# Patient Record
Sex: Female | Born: 1990 | Race: Black or African American | Hispanic: No | Marital: Single | Smoking: Current every day smoker
Health system: Southern US, Community
[De-identification: ages and names within clinical notes are randomized; demographics above are authoritative.]

## PROBLEM LIST (undated history)

## (undated) DIAGNOSIS — C569 Malignant neoplasm of unspecified ovary: Secondary | ICD-10-CM

## (undated) DIAGNOSIS — C801 Malignant (primary) neoplasm, unspecified: Secondary | ICD-10-CM

## (undated) DIAGNOSIS — R569 Unspecified convulsions: Secondary | ICD-10-CM

## (undated) DIAGNOSIS — J45909 Unspecified asthma, uncomplicated: Secondary | ICD-10-CM

---
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
## 2013-04-18 ENCOUNTER — Emergency Department (HOSPITAL_COMMUNITY)
Admission: EM | Admit: 2013-04-18 | Discharge: 2013-04-19 | Disposition: A | Discharge status: Home / Self Care | Payer: BC Managed Care – PPO | Attending: Emergency Medicine | Admitting: Emergency Medicine

## 2013-04-18 ENCOUNTER — Encounter (HOSPITAL_COMMUNITY): Payer: Self-pay | Admitting: Emergency Medicine

## 2013-04-18 DIAGNOSIS — J45901 Unspecified asthma with (acute) exacerbation: Secondary | ICD-10-CM | POA: Insufficient documentation

## 2013-04-18 DIAGNOSIS — Z880 Allergy status to penicillin: Secondary | ICD-10-CM | POA: Insufficient documentation

## 2013-04-18 DIAGNOSIS — F17200 Nicotine dependence, unspecified, uncomplicated: Secondary | ICD-10-CM | POA: Insufficient documentation

## 2013-04-18 DIAGNOSIS — F4321 Adjustment disorder with depressed mood: Secondary | ICD-10-CM | POA: Insufficient documentation

## 2013-04-18 DIAGNOSIS — F419 Anxiety disorder, unspecified: Secondary | ICD-10-CM

## 2013-04-18 DIAGNOSIS — R079 Chest pain, unspecified: Secondary | ICD-10-CM | POA: Insufficient documentation

## 2013-04-18 DIAGNOSIS — Z8543 Personal history of malignant neoplasm of ovary: Secondary | ICD-10-CM | POA: Insufficient documentation

## 2013-04-18 DIAGNOSIS — Z79899 Other long term (current) drug therapy: Secondary | ICD-10-CM | POA: Insufficient documentation

## 2013-04-18 DIAGNOSIS — G40909 Epilepsy, unspecified, not intractable, without status epilepticus: Secondary | ICD-10-CM | POA: Insufficient documentation

## 2013-04-18 DIAGNOSIS — F411 Generalized anxiety disorder: Secondary | ICD-10-CM | POA: Insufficient documentation

## 2013-04-18 HISTORY — DX: Unspecified convulsions: R56.9

## 2013-04-18 HISTORY — DX: Unspecified asthma, uncomplicated: J45.909

## 2013-04-18 HISTORY — DX: Malignant neoplasm of unspecified ovary: C56.9

## 2013-04-18 HISTORY — DX: Malignant (primary) neoplasm, unspecified: C80.1

## 2013-04-18 MED ORDER — LORAZEPAM 1 MG PO TABS
1.0000 mg | ORAL_TABLET | Freq: Once | ORAL | Status: AC
  Administered 2013-04-18: 1 mg via ORAL
  Filled 2013-04-18: qty 1

## 2013-04-18 MED ORDER — ALBUTEROL SULFATE (5 MG/ML) 0.5% IN NEBU
2.5000 mg | INHALATION_SOLUTION | Freq: Once | RESPIRATORY_TRACT | Status: AC
  Administered 2013-04-19: 2.5 mg via RESPIRATORY_TRACT
  Filled 2013-04-18: qty 0.5

## 2013-04-18 MED ORDER — CARBAMAZEPINE ER 200 MG PO CP12
200.0000 mg | ORAL_CAPSULE | Freq: Once | ORAL | Status: AC
  Administered 2013-04-18: 200 mg via ORAL
  Filled 2013-04-18: qty 1

## 2013-04-18 MED ORDER — CARBAMAZEPINE ER 200 MG PO CP12: 200.0000 mg | ORAL_CAPSULE | Freq: Once | ORAL | Status: DC
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
## 2013-04-18 NOTE — ED Notes (Signed)
Pt c/o discomfort in chest. States she has need to use albuterol inhaler on yesterday and nebulizer

## 2013-04-18 NOTE — ED Notes (Signed)
Pt BIB EMS. Pt told EMS she had a verbal altercation with her friend tonight. Pt also told EMS that her mother died this past week. Pt has a hx of epilepsy and told EMS that she is worried that she may have a "epileptic episode". Pt denies SI/HI. Pt admits to drinking 3 alcoholic beverages this evening. Pt calm, cooperative. Pt alert, no acute distress.

## 2013-04-18 NOTE — ED Notes (Signed)
Bed: WU98 Expected date: 04/18/13 Expected time: 10:25 PM Means of arrival: Ambulance Comments: Bed 24, EMS, 29 F, Hx of Seizure

## 2013-04-19 MED ORDER — LORAZEPAM 1 MG PO TABS: 1.0000 mg | ORAL_TABLET | Freq: Four times a day (QID) | ORAL | Status: DC | PRN

## 2013-04-19 NOTE — ED Notes (Signed)
Patient is resting comfortably. 

## 2013-04-19 NOTE — ED Notes (Signed)
Friend at bedside. Pt feeling drowsy from medication. Tolerating Coke. Concern about Discharge.

## 2013-04-20 NOTE — ED Provider Notes (Signed)
Medical screening examination/treatment/procedure(s) were performed by non-physician practitioner and as supervising physician I was immediately available for consultation/collaboration.   Sunnie Nielsen, MD 04/20/13 [phone]

## 2013-08-21 ENCOUNTER — Emergency Department (HOSPITAL_COMMUNITY): Payer: BC Managed Care – PPO

## 2013-08-21 ENCOUNTER — Encounter (HOSPITAL_COMMUNITY): Payer: Self-pay | Admitting: Emergency Medicine

## 2013-08-21 ENCOUNTER — Emergency Department (HOSPITAL_COMMUNITY)
Admission: EM | Admit: 2013-08-21 | Discharge: 2013-08-22 | Disposition: A | Discharge status: Home / Self Care | Payer: BC Managed Care – PPO | Attending: Emergency Medicine | Admitting: Emergency Medicine

## 2013-08-21 DIAGNOSIS — A084 Viral intestinal infection, unspecified: Secondary | ICD-10-CM

## 2013-08-21 DIAGNOSIS — Z3202 Encounter for pregnancy test, result negative: Secondary | ICD-10-CM | POA: Insufficient documentation

## 2013-08-21 DIAGNOSIS — Z8543 Personal history of malignant neoplasm of ovary: Secondary | ICD-10-CM | POA: Insufficient documentation

## 2013-08-21 DIAGNOSIS — J45909 Unspecified asthma, uncomplicated: Secondary | ICD-10-CM | POA: Insufficient documentation

## 2013-08-21 DIAGNOSIS — F17200 Nicotine dependence, unspecified, uncomplicated: Secondary | ICD-10-CM | POA: Insufficient documentation

## 2013-08-21 DIAGNOSIS — K7689 Other specified diseases of liver: Secondary | ICD-10-CM | POA: Insufficient documentation

## 2013-08-21 DIAGNOSIS — K921 Melena: Secondary | ICD-10-CM | POA: Insufficient documentation

## 2013-08-21 DIAGNOSIS — R109 Unspecified abdominal pain: Secondary | ICD-10-CM

## 2013-08-21 DIAGNOSIS — Z79899 Other long term (current) drug therapy: Secondary | ICD-10-CM | POA: Insufficient documentation

## 2013-08-21 DIAGNOSIS — Z90710 Acquired absence of both cervix and uterus: Secondary | ICD-10-CM | POA: Insufficient documentation

## 2013-08-21 DIAGNOSIS — G40909 Epilepsy, unspecified, not intractable, without status epilepticus: Secondary | ICD-10-CM | POA: Insufficient documentation

## 2013-08-21 DIAGNOSIS — N83202 Unspecified ovarian cyst, left side: Secondary | ICD-10-CM

## 2013-08-21 DIAGNOSIS — K4090 Unilateral inguinal hernia, without obstruction or gangrene, not specified as recurrent: Secondary | ICD-10-CM | POA: Insufficient documentation

## 2013-08-21 DIAGNOSIS — N83209 Unspecified ovarian cyst, unspecified side: Secondary | ICD-10-CM | POA: Insufficient documentation

## 2013-08-21 DIAGNOSIS — Z880 Allergy status to penicillin: Secondary | ICD-10-CM | POA: Insufficient documentation

## 2013-08-21 DIAGNOSIS — A088 Other specified intestinal infections: Secondary | ICD-10-CM | POA: Insufficient documentation

## 2013-08-21 LAB — POC URINE PREG, ED: PREG TEST UR: NEGATIVE

## 2013-08-21 LAB — URINALYSIS, ROUTINE W REFLEX MICROSCOPIC
Bilirubin Urine: NEGATIVE
Glucose, UA: NEGATIVE mg/dL
Hgb urine dipstick: NEGATIVE
Ketones, ur: >80 mg/dL — AB
LEUKOCYTES UA: NEGATIVE
NITRITE: NEGATIVE
PROTEIN: 100 mg/dL — AB
SPECIFIC GRAVITY, URINE: 1.026 (ref 1.005–1.030)
UROBILINOGEN UA: 0.2 mg/dL (ref 0.0–1.0)
pH: 8 (ref 5.0–8.0)

## 2013-08-21 LAB — COMPREHENSIVE METABOLIC PANEL
ALK PHOS: 54 U/L (ref 39–117)
ALT: 9 U/L (ref 0–35)
AST: 24 U/L (ref 0–37)
Albumin: 4.8 g/dL (ref 3.5–5.2)
BUN: 9 mg/dL (ref 6–23)
CALCIUM: 9.6 mg/dL (ref 8.4–10.5)
CO2: 23 meq/L (ref 19–32)
Chloride: 100 mEq/L (ref 96–112)
Creatinine, Ser: 0.65 mg/dL (ref 0.50–1.10)
GLUCOSE: 76 mg/dL (ref 70–99)
Potassium: 3.7 mEq/L (ref 3.7–5.3)
Sodium: 140 mEq/L (ref 137–147)
TOTAL PROTEIN: 8 g/dL (ref 6.0–8.3)
Total Bilirubin: 0.7 mg/dL (ref 0.3–1.2)

## 2013-08-21 LAB — CBC WITH DIFFERENTIAL/PLATELET
BASOS ABS: 0 10*3/uL (ref 0.0–0.1)
BASOS PCT: 0 % (ref 0–1)
Eosinophils Absolute: 0 10*3/uL (ref 0.0–0.7)
Eosinophils Relative: 0 % (ref 0–5)
HEMATOCRIT: 35.5 % — AB (ref 36.0–46.0)
Hemoglobin: 11.5 g/dL — ABNORMAL LOW (ref 12.0–15.0)
LYMPHS PCT: 10 % — AB (ref 12–46)
Lymphs Abs: 0.8 10*3/uL (ref 0.7–4.0)
MCH: 28.9 pg (ref 26.0–34.0)
MCHC: 32.4 g/dL (ref 30.0–36.0)
MCV: 89.2 fL (ref 78.0–100.0)
MONO ABS: 0.3 10*3/uL (ref 0.1–1.0)
Monocytes Relative: 3 % (ref 3–12)
NEUTROS ABS: 7.3 10*3/uL (ref 1.7–7.7)
NEUTROS PCT: 87 % — AB (ref 43–77)
Platelets: 253 10*3/uL (ref 150–400)
RBC: 3.98 MIL/uL (ref 3.87–5.11)
RDW: 16.1 % — AB (ref 11.5–15.5)
WBC: 8.4 10*3/uL (ref 4.0–10.5)

## 2013-08-21 LAB — LIPASE, BLOOD: LIPASE: 29 U/L (ref 11–59)

## 2013-08-21 LAB — URINE MICROSCOPIC-ADD ON

## 2013-08-21 LAB — POC OCCULT BLOOD, ED: Fecal Occult Bld: NEGATIVE

## 2013-08-21 MED ORDER — ONDANSETRON HCL 4 MG/2ML IJ SOLN
4.0000 mg | Freq: Once | INTRAMUSCULAR | Status: AC
  Administered 2013-08-21: 4 mg via INTRAVENOUS
  Filled 2013-08-21: qty 2

## 2013-08-21 MED ORDER — SODIUM CHLORIDE 0.9 % IV SOLN
INTRAVENOUS | Status: DC
  Administered 2013-08-21: via INTRAVENOUS

## 2013-08-21 MED ORDER — ONDANSETRON 8 MG PO TBDP
8.0000 mg | ORAL_TABLET | Freq: Once | ORAL | Status: AC
  Administered 2013-08-21: 8 mg via ORAL
  Filled 2013-08-21: qty 1

## 2013-08-21 NOTE — ED Notes (Addendum)
Pt reports centralized abdominal pain, which intermittently radiates to bilateral flank areas that began at 1500 today. Pt also reports nausea, emesis, and diarrhea which began at 1200 today. Pt reports a history of ovarian cysts. Pt is also concerned about a "lump" in the right side of her groin. Pt is A/O x4, in NAD, and vitals are WDL.

## 2013-08-21 NOTE — ED Provider Notes (Signed)
CSN: [id number]     Arrival date & time 08/21/13  1827 History   First MD Initiated Contact with Patient 08/21/13 2245     Chief Complaint  Patient presents with  . Abdominal Pain  . Emesis     (Consider location/radiation/quality/duration/timing/severity/associated sxs/prior Treatment) The history is provided by the patient. No language interpreter was used.  Courtney Camacho is a 23 y/o F with PMHx of seizures, ovarian cysts, asthma presenting to the ED with abdominal pain, nausea, vomiting, diarrhea that started this afternoon at approximately 12:00PM. Patient reported that the pain is localized to the center of her abdomen described as a sharp pain, ripping pain without radiation. Stated that she has been having shooting pains around her abdomen. Patient reported that she has been having episodes of emesis. Reported that she has had at least 20-30 episodes of emesis today - reported that it was of bile, as per patient's report. Stated that she had one episode of black tarry stool - stated that this occurred at 5:45-6:00PM. Stated that the remaining bowel movements she has had is with diarrhea. Reported that she is unable to keep any food or fluids down. Patient reported that she was given zofran from nausea while in the ED waiting that aided in her discomfort. Reported that she has history of ovarian cysts and an abortion 4-5 years ago. LMP a week and half ago. Denied chest pain, shortness of breath, difficulty breathing, fever, urinary issues, hematuria, back pain, neck pain, neck stiffness, vaginal pain, vaginal discharge. PCP none   Past Medical History  Diagnosis Date  . Seizures   . Cancer     ovarian   . Ovarian cancer   . Asthma    Past Surgical History  Procedure Laterality Date  . Abdominal hysterectomy  2012   No family history on file. History  Substance Use Topics  . Smoking status: Current Every Day Smoker  . Smokeless tobacco: Never Used  . Alcohol Use: Yes   Comment: socially   OB History   Grav Para Term Preterm Abortions TAB SAB Ect Mult Living                 Review of Systems  Constitutional: Positive for chills. Negative for fever.  HENT: Negative for trouble swallowing.   Respiratory: Negative for chest tightness and shortness of breath.   Cardiovascular: Negative for chest pain.  Gastrointestinal: Positive for nausea, vomiting, abdominal pain, diarrhea and blood in stool. Negative for constipation and anal bleeding.  Genitourinary: Negative for decreased urine volume, vaginal bleeding, vaginal discharge and vaginal pain.  Musculoskeletal: Negative for back pain and neck pain.  Neurological: Negative for dizziness and weakness.  All other systems reviewed and are negative.      Allergies  Bee venom; Penicillins; and Morphine and related  Home Medications   Current Outpatient Rx  Name  Route  Sig  Dispense  Refill  . albuterol (PROVENTIL HFA;VENTOLIN HFA) 108 (90 BASE) MCG/ACT inhaler   Inhalation   Inhale 2 puffs into the lungs every 6 (six) hours as needed for wheezing.         Marland Kitchen albuterol (PROVENTIL) (2.5 MG/3ML) 0.083% nebulizer solution   Nebulization   Take 2.5 mg by nebulization every 6 (six) hours as needed for wheezing.         Marland Kitchen EPINEPHrine (EPIPEN) 0.3 mg/0.3 mL SOAJ injection   Intramuscular   Inject 0.3 mg into the muscle once as needed (allergic reaction).         Marland Kitchen  carbamazepine (CARBATROL) 200 MG 12 hr capsule   Oral   Take 200 mg by mouth 2 (two) times daily.         Marland Kitchen loratadine (CLARITIN) 10 MG tablet   Oral   Take 10 mg by mouth every morning.          BP 138/78  Pulse 81  Temp(Src) 98.9 F (37.2 C) (Oral)  Resp 18  SpO2 96%  LMP 08/13/2013 Physical Exam  Nursing note and vitals reviewed. Constitutional: She is oriented to person, place, and time. She appears well-developed and well-nourished. No distress.  HENT:  Head: Normocephalic and atraumatic.  Mouth/Throat: Oropharynx  is clear and moist. No oropharyngeal exudate.  Eyes: Conjunctivae and EOM are normal. Pupils are equal, round, and reactive to light. Right eye exhibits no discharge. Left eye exhibits no discharge.  Neck: Normal range of motion. Neck supple. No tracheal deviation present.  Cardiovascular: Normal rate, regular rhythm and normal heart sounds.  Exam reveals no friction rub.   No murmur heard. Pulses:      Radial pulses are 2+ on the right side, and 2+ on the left side.       Dorsalis pedis pulses are 2+ on the right side, and 2+ on the left side.  Pulmonary/Chest: Effort normal and breath sounds normal. No respiratory distress. She has no wheezes. She has no rales.  Abdominal: Soft. Normal appearance and bowel sounds are normal. She exhibits no distension, no fluid wave and no ascites. There is tenderness in the right lower quadrant and left lower quadrant. There is guarding. There is negative Murphy's sign.    Negative distension noted Positive discomfort upon palpation to the abdomen, mainly localized to the right and left lower quadrants.  Positive guarding upon palpation to the abdomen Negative Murphy's sign  Genitourinary:  Rectal Exam: Negative swelling, erythema, inflammation, lesions, sores, external hemorrhoids noted to the anus. Negative masses or internal hemorrhoids noted upon palpation to the rectum. Negative blood on glove. Brown stool on glove.   Pelvic exam: Negative swelling, erythema, inflammation, lesions, sores noted to the external genitalia. Negative swelling, erythema, inflammation, lesions, sores, masses noted to the vaginal canal. Negative blood in the vaginal vault. Negative discharge noted. Negative swelling, erythema, inflammation, lesions, sores, friability noted to the cervix. Negative CMT. Positive bilateral adnexal tenderness.   Musculoskeletal: Normal range of motion.  Full ROM to upper and lower extremities without difficulty noted, negative ataxia noted.    Lymphadenopathy:    She has no cervical adenopathy.  Neurological: She is alert and oriented to person, place, and time. No cranial nerve deficit. She exhibits normal muscle tone. Coordination normal.  Cranial nerves III-XII grossly intact Strength 5+/5+ to upper and lower extremities bilaterally with resistance applied, equal distribution noted  Skin: Skin is warm and dry. No rash noted. She is not diaphoretic. No erythema.  Psychiatric: She has a normal mood and affect. Her behavior is normal. Thought content normal.    ED Course  Procedures (including critical care time)  2:37 AM This provider re-assessed the patient. Discussed labs and imaging. Discussed with patient plan to rule out torsion since patient continues to have pain and pain with pelvic exam. Patient understood and agreed to plan.   Results for orders placed during the hospital encounter of 08/21/13  WET PREP, GENITAL      Result Value Ref Range   Yeast Wet Prep HPF POC NONE SEEN  NONE SEEN   Trich, Wet Prep NONE SEEN  NONE SEEN   Clue Cells Wet Prep HPF POC FEW (*) NONE SEEN   WBC, Wet Prep HPF POC FEW (*) NONE SEEN  COMPREHENSIVE METABOLIC PANEL      Result Value Ref Range   Sodium 140  137 - 147 mEq/L   Potassium 3.7  3.7 - 5.3 mEq/L   Chloride 100  96 - 112 mEq/L   CO2 23  19 - 32 mEq/L   Glucose, Bld 76  70 - 99 mg/dL   BUN 9  6 - 23 mg/dL   Creatinine, Ser 0.65  0.50 - 1.10 mg/dL   Calcium 9.6  8.4 - 10.5 mg/dL   Total Protein 8.0  6.0 - 8.3 g/dL   Albumin 4.8  3.5 - 5.2 g/dL   AST 24  0 - 37 U/L   ALT 9  0 - 35 U/L   Alkaline Phosphatase 54  39 - 117 U/L   Total Bilirubin 0.7  0.3 - 1.2 mg/dL   GFR calc non Af Amer >90  >90 mL/min   GFR calc Af Amer >90  >90 mL/min  CBC WITH DIFFERENTIAL      Result Value Ref Range   WBC 8.4  4.0 - 10.5 K/uL   RBC 3.98  3.87 - 5.11 MIL/uL   Hemoglobin 11.5 (*) 12.0 - 15.0 g/dL   HCT 35.5 (*) 36.0 - 46.0 %   MCV 89.2  78.0 - 100.0 fL   MCH 28.9  26.0 - 34.0 pg    MCHC 32.4  30.0 - 36.0 g/dL   RDW 16.1 (*) 11.5 - 15.5 %   Platelets 253  150 - 400 K/uL   Neutrophils Relative % 87 (*) 43 - 77 %   Neutro Abs 7.3  1.7 - 7.7 K/uL   Lymphocytes Relative 10 (*) 12 - 46 %   Lymphs Abs 0.8  0.7 - 4.0 K/uL   Monocytes Relative 3  3 - 12 %   Monocytes Absolute 0.3  0.1 - 1.0 K/uL   Eosinophils Relative 0  0 - 5 %   Eosinophils Absolute 0.0  0.0 - 0.7 K/uL   Basophils Relative 0  0 - 1 %   Basophils Absolute 0.0  0.0 - 0.1 K/uL  URINALYSIS, ROUTINE W REFLEX MICROSCOPIC      Result Value Ref Range   Color, Urine YELLOW  YELLOW   APPearance CLEAR  CLEAR   Specific Gravity, Urine 1.026  1.005 - 1.030   pH 8.0  5.0 - 8.0   Glucose, UA NEGATIVE  NEGATIVE mg/dL   Hgb urine dipstick NEGATIVE  NEGATIVE   Bilirubin Urine NEGATIVE  NEGATIVE   Ketones, ur >80 (*) NEGATIVE mg/dL   Protein, ur 100 (*) NEGATIVE mg/dL   Urobilinogen, UA 0.2  0.0 - 1.0 mg/dL   Nitrite NEGATIVE  NEGATIVE   Leukocytes, UA NEGATIVE  NEGATIVE  LIPASE, BLOOD      Result Value Ref Range   Lipase 29  11 - 59 U/L  URINE MICROSCOPIC-ADD ON      Result Value Ref Range   Squamous Epithelial / LPF FEW (*) RARE   WBC, UA 0-2  <3 WBC/hpf   RBC / HPF 0-2  <3 RBC/hpf   Bacteria, UA RARE  RARE  POC URINE PREG, ED      Result Value Ref Range   Preg Test, Ur NEGATIVE  NEGATIVE  POC OCCULT BLOOD, ED      Result Value Ref Range   Fecal  Occult Bld NEGATIVE  NEGATIVE     Ct Abdomen Pelvis W Contrast  08/22/2013   CLINICAL DATA:  Mid abdominal pain, nausea/vomiting/diarrhea, right groin lump  EXAM: CT ABDOMEN AND PELVIS WITH CONTRAST  TECHNIQUE: Multidetector CT imaging of the abdomen and pelvis was performed using the standard protocol following bolus administration of intravenous contrast.  CONTRAST:  4mL OMNIPAQUE IOHEXOL 300 MG/ML SOLN, 123mL OMNIPAQUE IOHEXOL 300 MG/ML SOLN  COMPARISON:  None.  FINDINGS: Lung bases are clear.  Two left hepatic lobe cysts measuring up to 5 mm (Series 2/ image  14). 8 mm cyst versus hemangioma in the posterior segment right hepatic lobe (series 2/ image 24).  Spleen, pancreas, and adrenal glands are within normal limits.  Gallbladder is unremarkable. No intrahepatic or extrahepatic ductal dilatation.  Two punctate nonobstructing right lower pole renal calculi (series 5/ image 37). Suspected punctate interpolar left renal calculus (series 5/ image 44). No hydronephrosis.  No evidence of bowel obstruction.  Normal appendix.  No evidence of abdominal aortic aneurysm.  Trace pelvic ascites, likely physiologic.  No suspicious abdominopelvic lymphadenopathy.  Retroverted uterus. Bilateral ovaries are unremarkable, noting a 2.5 cm left ovarian cyst/follicle, physiologic.  Bladder is within normal limits.  Calcified pelvic phleboliths.  Tiny fat containing right inguinal hernia.  Visualized osseous structures are within normal limits.  IMPRESSION: No evidence of bowel obstruction.  Normal appendix.  Suspected punctate bilateral nonobstructing renal calculi. No hydronephrosis.  2.5 cm left ovarian cyst/ follicle, physiologic.  Tiny fat containing right inguinal hernia.   Electronically Signed   By: Julian Hy M.D.   On: 08/22/2013 02:05     Labs Review Labs Reviewed  CBC WITH DIFFERENTIAL - Abnormal; Notable for the following:    Hemoglobin 11.5 (*)    HCT 35.5 (*)    RDW 16.1 (*)    Neutrophils Relative % 87 (*)    Lymphocytes Relative 10 (*)    All other components within normal limits  URINALYSIS, ROUTINE W REFLEX MICROSCOPIC - Abnormal; Notable for the following:    Ketones, ur >80 (*)    Protein, ur 100 (*)    All other components within normal limits  URINE MICROSCOPIC-ADD ON - Abnormal; Notable for the following:    Squamous Epithelial / LPF FEW (*)    All other components within normal limits  COMPREHENSIVE METABOLIC PANEL  LIPASE, BLOOD  POC URINE PREG, ED   Imaging Review No results found.   EKG Interpretation None      MDM   Final  diagnoses:  None   Medications  0.9 %  sodium chloride infusion ( Intravenous New Bag/Given 08/21/13 2356)  ondansetron (ZOFRAN-ODT) disintegrating tablet 8 mg (8 mg Oral Given 08/21/13 1853)  ondansetron (ZOFRAN) injection 4 mg (4 mg Intravenous Given 08/21/13 2355)  iohexol (OMNIPAQUE) 300 MG/ML solution 50 mL (50 mLs Oral Contrast Given 08/22/13 0013)  ketorolac (TORADOL) 15 MG/ML injection 30 mg (30 mg Intravenous Given 08/22/13 0056)  iohexol (OMNIPAQUE) 300 MG/ML solution 100 mL (100 mLs Intravenous Contrast Given 08/22/13 0115)   Filed Vitals:   08/21/13 1845 08/21/13 2347  BP: 138/78 143/84  Pulse: 81 83  Temp: 98.9 F (37.2 C) 98.6 F (37 C)  TempSrc: Oral Oral  Resp: 18   SpO2: 96% 100%    Patient presenting to the ED with abdominal pain, nausea, vomiting, diarrhea, and one episode of bloody stools this afternoon. Patient reported that symptoms started today around 12:00PM. Stated that the abdominal pain is in the  center of the abdomen described as a ripping pain, sharp pain that is constant. Stated that she has been unable to keep any foods or fluids down - stated that she has had numerous episodes of emesis, reported noticing bile. Stated that she has been having chills.  Alert and oriented. GCS 15. Heart rate and rhythm normal. Lungs clear to auscultation to upper and lower lobes bilaterally. Radial and DP pulses 2+ bilaterally. Cap refill < 3 seconds. Full ROM to upper and lower extremities bilaterally. BS normoactive in all 4 quadrants. Negative distension noted to the abdomen. Discomfort upon palpation to the abdomen, most discomfort noted to bilateral lower quadrants. Negative murphy's sign.  CBC negative elevation white blood cell count noted to the left shift or leukocytosis. Negative drop in Hgb and Hct - doubt acute bleed. CMP negative findings. Lipase negative elevation. Urine pregnancy negative. Urinalysis negative for nitrites, negative leukocytes patient presents for  infection or pyuria. Fecal occult negative. Wet prep noted few clue cells and few WBC. CT abdomen and pelvis with contrast with negative findings for acute abdominal processes. 2.5 cm left ovarian cyst noted. Punctate bilateral non-obstructing renal calculi noted without hydronephrosis. GC/Chlamydia probe pending.  Doubt SBO. Doubt appendicitis. Doubt cholecystitis. Doubt acute abdominal processes. Doubt pancreatitis. Doubt UTI. Doubt ectopic pregnancy. Doubt pyelonephritis. Left ovarian cyst noted on CT. Non-obstructing renal calculi noted to bilateral kidneys.  US pelvic and doppler ordered and pending. Discussed case with Martie Lee, PA-C at change in shift. Transfer of care to [employer]. D[place]en, PA-C at change in shift.  Jamse Mead, PA-C 08/22/13 1222

## 2013-08-21 NOTE — ED Notes (Signed)
Pt c/o lower, medial abdominal pain since this afternoon. Pt very tearful upon examination. Pt bent over in pain, however, once pt. Was engaged in conversation she relaxed and stopped crying. Pt also began to talk about how she had an abortion a couple of years ago and her "stomach has never been the same." Pt sts her doctor told her she might have ovarian cancer but she was too scared to get it check out a year ago. Pt became tearful when talking about the possibility of cancer. Pt also sts she has a bump in her groin that worries her. A&Ox4.

## 2013-08-22 ENCOUNTER — Emergency Department (HOSPITAL_COMMUNITY): Payer: BC Managed Care – PPO

## 2013-08-22 LAB — WET PREP, GENITAL
TRICH WET PREP: NONE SEEN
Yeast Wet Prep HPF POC: NONE SEEN

## 2013-08-22 MED ORDER — ONDANSETRON HCL 4 MG/2ML IJ SOLN
4.0000 mg | Freq: Once | INTRAMUSCULAR | Status: DC
  Filled 2013-08-22: qty 2

## 2013-08-22 MED ORDER — IOHEXOL 300 MG/ML  SOLN
100.0000 mL | Freq: Once | INTRAMUSCULAR | Status: AC | PRN
  Administered 2013-08-22: 100 mL via INTRAVENOUS

## 2013-08-22 MED ORDER — SODIUM CHLORIDE 0.9 % IV BOLUS (SEPSIS)
1000.0000 mL | Freq: Once | INTRAVENOUS | Status: AC
  Administered 2013-08-22: 1000 mL via INTRAVENOUS

## 2013-08-22 MED ORDER — KETOROLAC TROMETHAMINE 15 MG/ML IJ SOLN
30.0000 mg | Freq: Once | INTRAMUSCULAR | Status: AC
  Administered 2013-08-22: 30 mg via INTRAVENOUS
  Filled 2013-08-22: qty 2

## 2013-08-22 MED ORDER — MELOXICAM 15 MG PO TABS: 15.0000 mg | ORAL_TABLET | Freq: Every day | ORAL | Status: AC

## 2013-08-22 MED ORDER — IOHEXOL 300 MG/ML  SOLN
50.0000 mL | Freq: Once | INTRAMUSCULAR | Status: AC | PRN
  Administered 2013-08-22: 50 mL via ORAL

## 2013-08-22 MED ORDER — PROMETHAZINE HCL 25 MG PO TABS: 25.0000 mg | ORAL_TABLET | Freq: Four times a day (QID) | ORAL | Status: AC | PRN

## 2013-08-22 NOTE — ED Provider Notes (Signed)
Medical screening examination/treatment/procedure(s) were performed by non-physician practitioner and as supervising physician I was immediately available for consultation/collaboration.   Delora Fuel, MD 38/75/64 [id number]

## 2013-08-22 NOTE — Discharge Instructions (Signed)
You were seen and evaluated for your abdominal pains. Your lab testing, CAT scan and ultrasound studies did not show any signs for an emergent condition. You were found to have a left ovarian cyst measuring 2.6 cm as well as small cysts on the liver. There was also signs of a small hernia on the right groin area. There were no other concerning features. Please followup with a primary care provider or an OB/GYN specialist for continued evaluation and treatment of your symptoms.     Abdominal Pain, Adult Many things can cause abdominal pain. Usually, abdominal pain is not caused by a disease and will improve without treatment. It can often be observed and treated at home. Your health care provider will do a physical exam and possibly order blood tests and X-rays to help determine the seriousness of your pain. However, in many cases, more time must pass before a clear cause of the pain can be found. Before that point, your health care provider may not know if you need more testing or further treatment. HOME CARE INSTRUCTIONS  Monitor your abdominal pain for any changes. The following actions may help to alleviate any discomfort you are experiencing:  Only take over-the-counter or prescription medicines as directed by your health care provider.  Do not take laxatives unless directed to do so by your health care provider.  Try a clear liquid diet (broth, tea, or water) as directed by your health care provider. Slowly move to a bland diet as tolerated. SEEK MEDICAL CARE IF:  You have unexplained abdominal pain.  You have abdominal pain associated with nausea or diarrhea.  You have pain when you urinate or have a bowel movement.  You experience abdominal pain that wakes you in the night.  You have abdominal pain that is worsened or improved by eating food.  You have abdominal pain that is worsened with eating fatty foods. SEEK IMMEDIATE MEDICAL CARE IF:   Your pain does not go away within 2  hours.  You have a fever.  You keep throwing up (vomiting).  Your pain is felt only in portions of the abdomen, such as the right side or the left lower portion of the abdomen.  You pass bloody or black tarry stools. MAKE SURE YOU:  Understand these instructions.   Will watch your condition.   Will get help right away if you are not doing well or get worse.  Document Released: 03/21/2005 Document Revised: 04/01/2013 Document Reviewed: 02/18/2013 [facility] Patient Information 2014 [place].   Ovarian Cyst An ovarian cyst is a sac filled with fluid or blood. This sac is attached to the ovary. Some cysts go away on their own. Other cysts need treatment.  HOME CARE   Only take medicine as told by your doctor.  Follow up with your doctor as told.  Get regular pelvic exams and Pap tests. GET HELP IF:  Your periods are late, not regular, or painful.  You stop having periods.  Your belly (abdominal) or pelvic pain does not go away.  Your belly becomes large or puffy (swollen).  You have a hard time peeing (totally emptying your bladder).  You have pressure on your bladder.  You have pain during sex.  You feel fullness, pressure, or discomfort in your belly.  You lose weight for no reason.  You feel sick most of the time.  You have a hard time pooping (constipation).  You do not feel like eating.  You develop pimples (acne).  You have  an increase in hair on your body and face.  You are gaining weight for no reason.  You think you are pregnant. GET HELP RIGHT AWAY IF:   Your belly pain gets worse.  You feel sick to your stomach (nauseous), and you throw up (vomit).  You have a fever that comes on fast.  You have belly pain while pooping (bowel movement).  Your periods are heavier than usual. MAKE SURE YOU:   Understand these instructions.  Will watch your condition.  Will get help right away if you are not doing well or get  worse. Document Released: 11/28/2007 Document Revised: 04/01/2013 Document Reviewed: 02/16/2013 [facility] Patient Information 2014 [place].     Viral Gastroenteritis Viral gastroenteritis is also called stomach flu. This illness is caused by a certain type of germ (virus). It can cause sudden watery poop (diarrhea) and throwing up (vomiting). This can cause you to lose body fluids (dehydration). This illness usually lasts for 3 to 8 days. It usually goes away on its own. HOME CARE   Drink enough fluids to keep your pee (urine) clear or pale yellow. Drink small amounts of fluids often.  Ask your doctor how to replace body fluid losses (rehydration).  Avoid:  Foods high in sugar.  Alcohol.  Bubbly (carbonated) drinks.  Tobacco.  Juice.  Caffeine drinks.  Very hot or cold fluids.  Fatty, greasy foods.  Eating too much at one time.  Dairy products until 24 to 48 hours after your watery poop stops.  You may eat foods with active cultures (probiotics). They can be found in some yogurts and supplements.  Wash your hands well to avoid spreading the illness.  Only take medicines as told by your doctor. Do not give aspirin to children. Do not take medicines for watery poop (antidiarrheals).  Ask your doctor if you should keep taking your regular medicines.  Keep all doctor visits as told. GET HELP RIGHT AWAY IF:   You cannot keep fluids down.  You do not pee at least once every 6 to 8 hours.  You are short of breath.  You see blood in your poop or throw up. This may look like coffee grounds.  You have belly (abdominal) pain that gets worse or is just in one small spot (localized).  You keep throwing up or having watery poop.  You have a fever.  The patient is a child younger than 3 months, and he or she has a fever.  The patient is a child older than 3 months, and he or she has a fever and problems that do not go away.  The patient is a child older than  3 months, and he or she has a fever and problems that suddenly get worse.  The patient is a baby, and he or she has no tears when crying. MAKE SURE YOU:   Understand these instructions.  Will watch your condition.  Will get help right away if you are not doing well or get worse. Document Released: 11/28/2007 Document Revised: 09/03/2011 Document Reviewed: 03/28/2011 [facility] Patient Information 2014 [place].

## 2013-08-22 NOTE — ED Provider Notes (Signed)
Courtney Camacho S 2:30 AM patient discussed in sign out. Patient having lower abdominal pains that began early yesterday. Symptoms with nausea, vomiting and diarrhea. Patient had complete workup including CT scan which did incidentally show a left ovarian cyst. Patient going for other testing of an ultrasound to rule out any complications of the ovaries including torsion.   4:00 AM ultrasound studies confirm a left ovarian cyst. There is good blood flow to both ovaries without torsion. No other concerning features. Patient may be discharged home. Patient likely with viral gastroenteritis causing Courtney Camacho vomiting and diarrhea. She other incidental findings on CT scan discussed with the patient. She has been encouraged to followup with Courtney Camacho primary care provider or Courtney Camacho OB/GYN specialist and agrees.  Martie Lee, PA-C 08/22/13 [phone]

## 2013-08-22 NOTE — ED Notes (Signed)
US at bedside

## 2013-08-22 NOTE — ED Notes (Signed)
Bed: JE56 Expected date:  Expected time:  Means of arrival:  Comments: Hold for Room 7

## 2013-08-22 NOTE — ED Provider Notes (Signed)
Medical screening examination/treatment/procedure(s) were performed by non-physician practitioner and as supervising physician I was immediately available for consultation/collaboration.    Edsel Shives R Annalynn Centanni, MD 08/22/13 [id number] 

## 2013-08-24 LAB — GC/CHLAMYDIA PROBE AMP
CT PROBE, AMP APTIMA: NEGATIVE
GC PROBE AMP APTIMA: NEGATIVE

## 2015-06-03 IMAGING — CT CT ABD-PELV W/ CM
1 of 2 series · 15 of 32 positions shown, 19 images · IV contrast (OMNIPAQUE 300)
Comparison: None.

CLINICAL DATA: Mid abdominal pain, nausea/vomiting/diarrhea, right
groin lump

EXAM:
CT ABDOMEN AND PELVIS WITH CONTRAST
TECHNIQUE: Multidetector CT imaging of the abdomen and pelvis was performed
using the standard protocol following bolus administration of
intravenous contrast.
CONTRAST:  50mL OMNIPAQUE IOHEXOL 300 MG/ML SOLN, 100mL OMNIPAQUE
IOHEXOL 300 MG/ML SOLN

[Series 2: abd/pel with · axial · 0.74mm/px · z∈[-480,-60]mm · 15 of 94 slices shown, 19 images]
[im 5/94  soft-tissue]
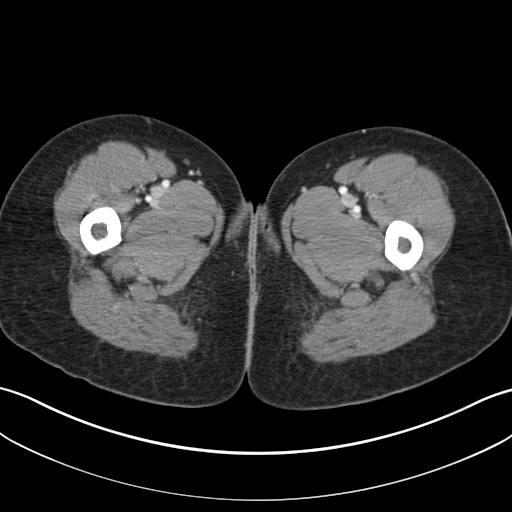
[im 5/94  bone]
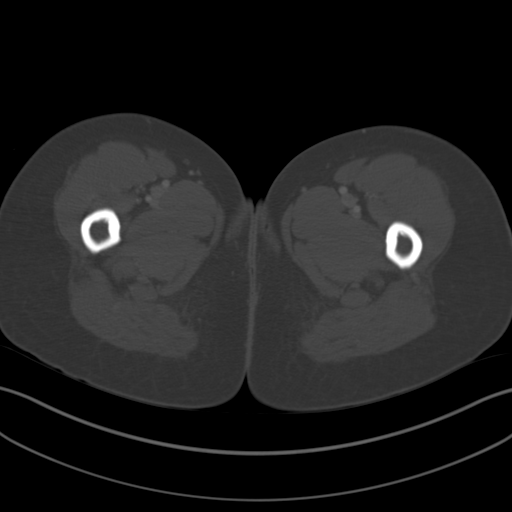
[im 13/94  soft-tissue]
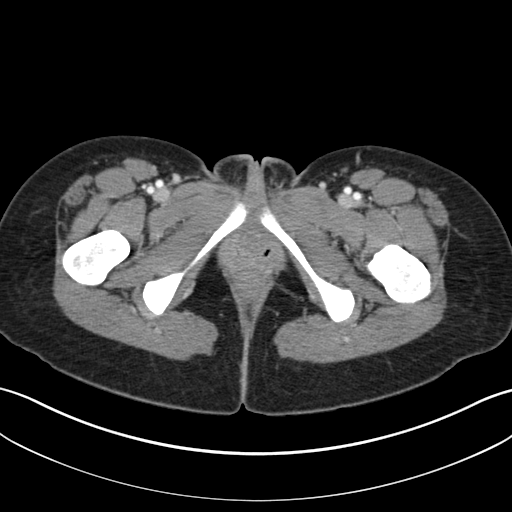
[im 21/94  soft-tissue]
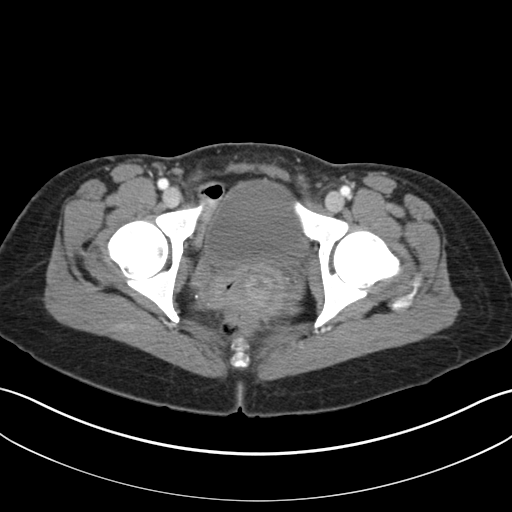
[im 25/94  soft-tissue]
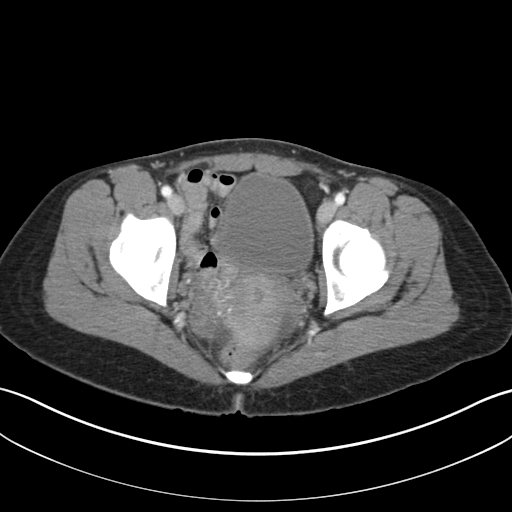
[im 33/94  soft-tissue]
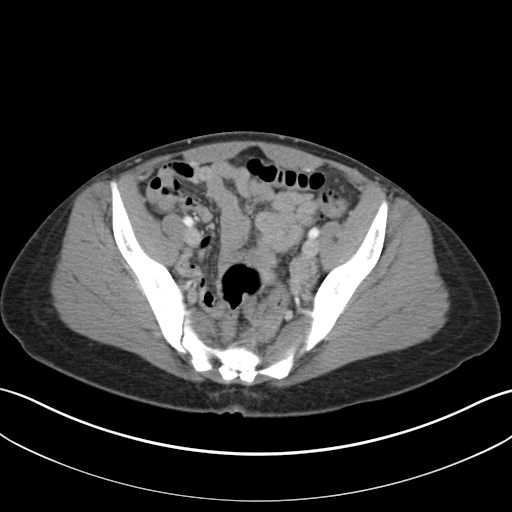
[im 41/94  soft-tissue]
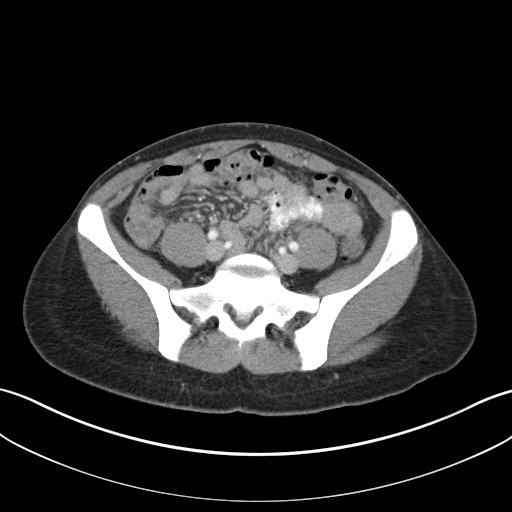
[im 49/94  soft-tissue]
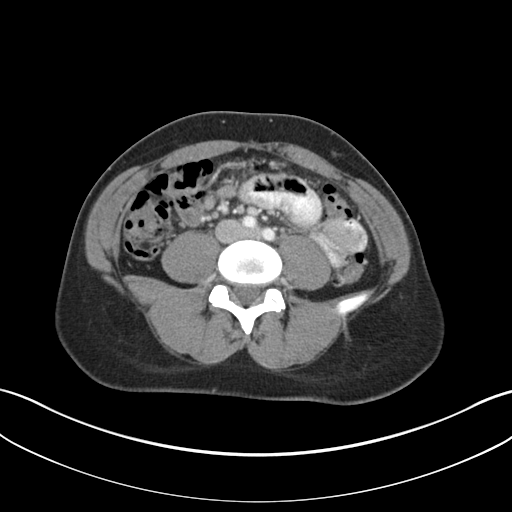
[im 53/94  soft-tissue]
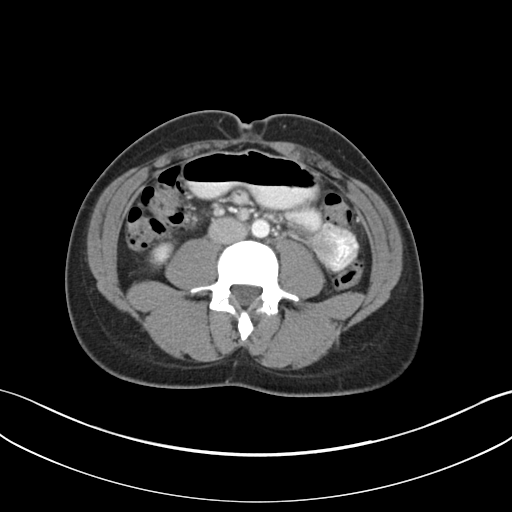
[im 61/94  soft-tissue]
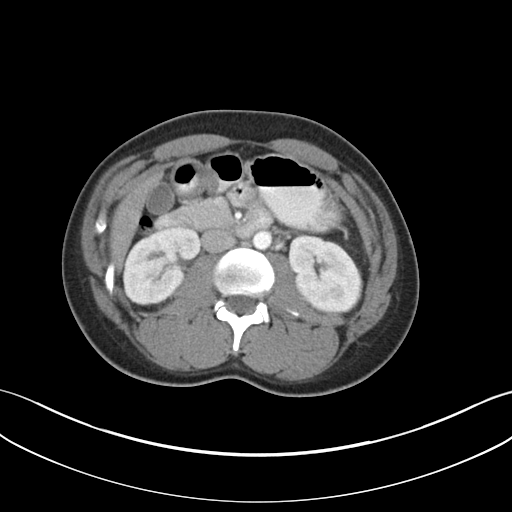
[im 61/94  bone]
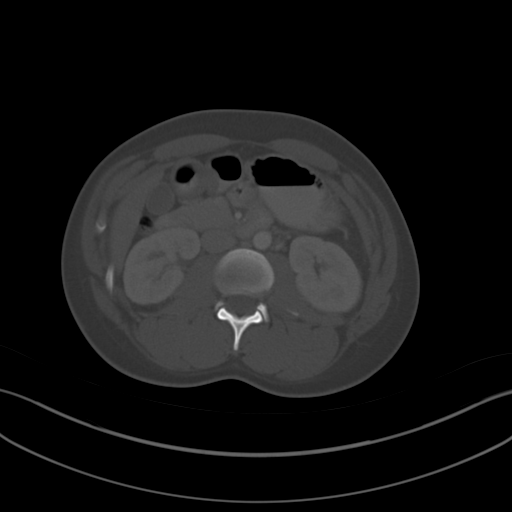
[im 69/94  soft-tissue]
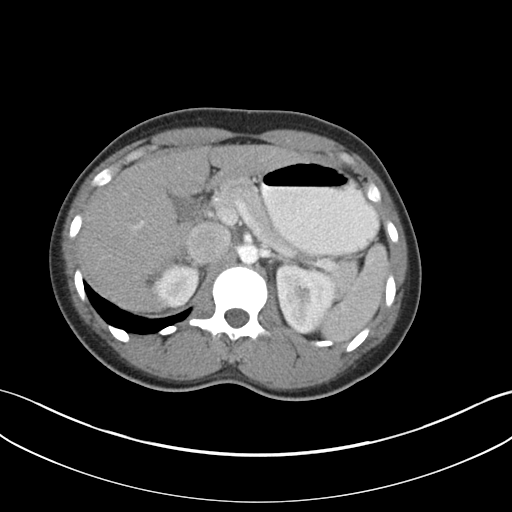
[im 73/94  soft-tissue]
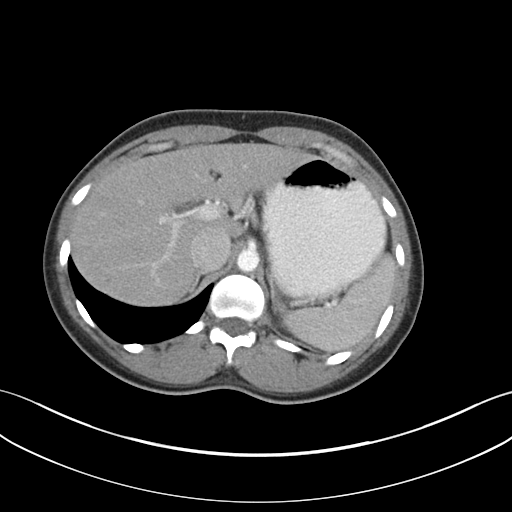
[im 77/94  lung]
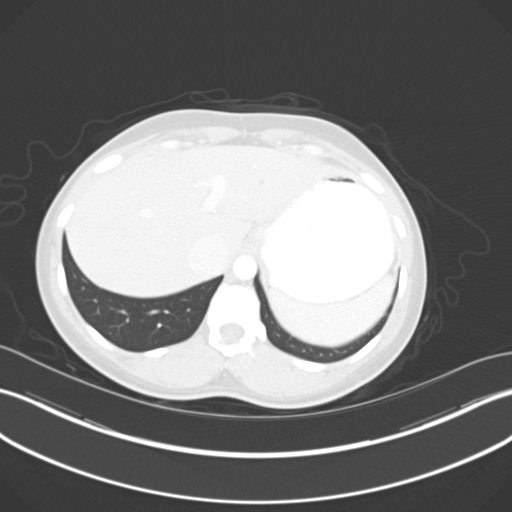
[im 81/94  soft-tissue]
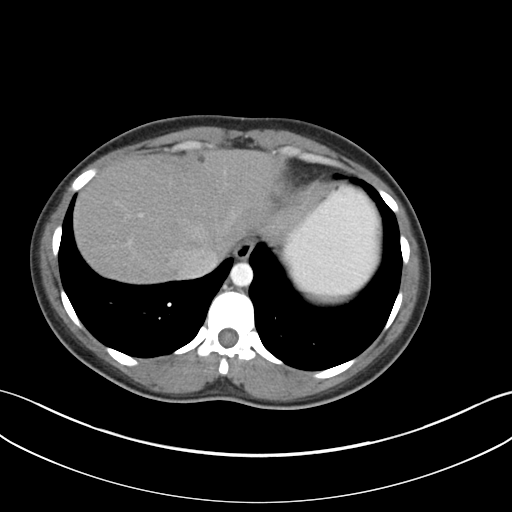
[im 81/94  lung]
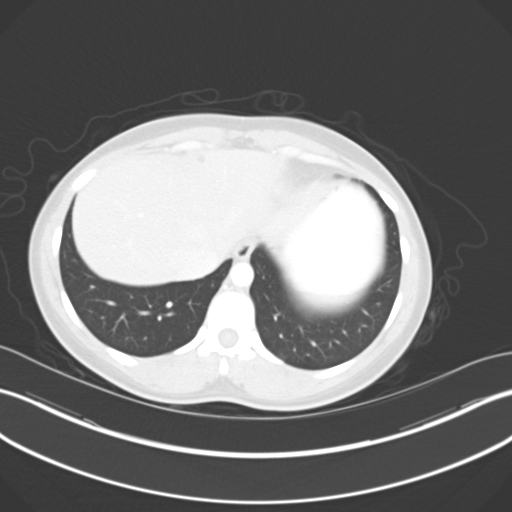
[im 85/94  lung]
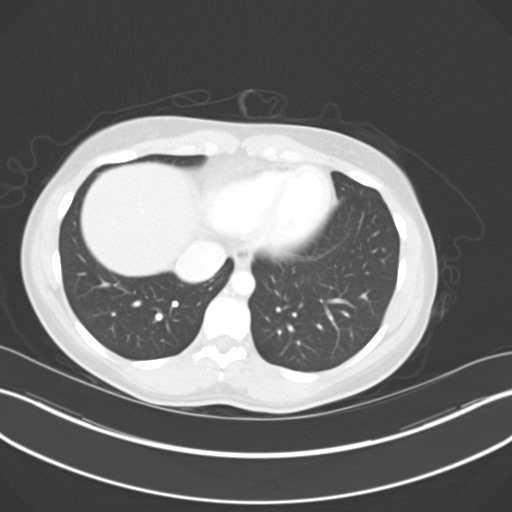
[im 89/94  soft-tissue]
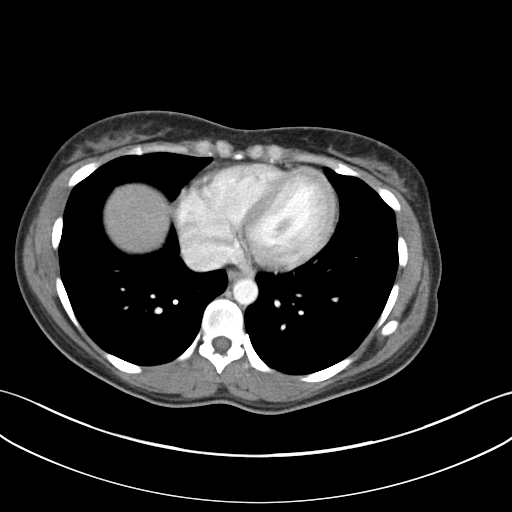
[im 89/94  lung]
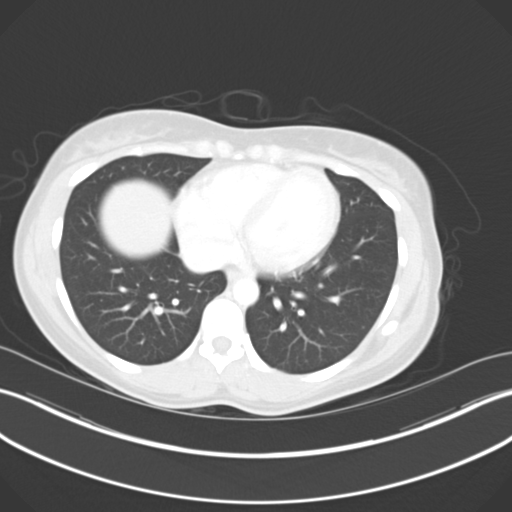

[15 of 32 positions shown; findings below may reference images not displayed]

FINDINGS: Lung bases are clear.

Two left hepatic lobe cysts measuring up to 5 mm (Series 2/ image
14). 8 mm cyst versus hemangioma in the posterior segment right
hepatic lobe (series 2/ image 24).

Spleen, pancreas, and adrenal glands are within normal limits.

Gallbladder is unremarkable. No intrahepatic or extrahepatic ductal
dilatation.

Two punctate nonobstructing right lower pole renal calculi (series
5/ image 37). Suspected punctate interpolar left renal calculus
(series 5/ image 44). No hydronephrosis.

No evidence of bowel obstruction.  Normal appendix.

No evidence of abdominal aortic aneurysm.

Trace pelvic ascites, likely physiologic.

No suspicious abdominopelvic lymphadenopathy.

Retroverted uterus. Bilateral ovaries are unremarkable, noting a
cm left ovarian cyst/follicle, physiologic.

Bladder is within normal limits.

Calcified pelvic phleboliths.

Tiny fat containing right inguinal hernia.

Visualized osseous structures are within normal limits.
IMPRESSION: No evidence of bowel obstruction.  Normal appendix.

Suspected punctate bilateral nonobstructing renal calculi. No
hydronephrosis.

2.5 cm left ovarian cyst/ follicle, physiologic.

Tiny fat containing right inguinal hernia.
